# Patient Record
Sex: Male | Born: 1975 | Race: White | Hispanic: Yes | Marital: Single | Smoking: Never smoker
Health system: Southern US, Community
[De-identification: ages and names within clinical notes are randomized; demographics above are authoritative.]

## PROBLEM LIST (undated history)

## (undated) DIAGNOSIS — Z789 Other specified health status: Secondary | ICD-10-CM

## (undated) HISTORY — PX: FRACTURE SURGERY: SHX138

---

## 2005-11-06 ENCOUNTER — Emergency Department (HOSPITAL_COMMUNITY): Admission: EM | Admit: 2005-11-06 | Discharge: 2005-11-06 | Payer: Self-pay | Admitting: Emergency Medicine

## 2009-04-04 ENCOUNTER — Emergency Department (HOSPITAL_COMMUNITY): Admission: EM | Admit: 2009-04-04 | Discharge: 2009-04-04 | Payer: Self-pay | Admitting: Emergency Medicine

## 2010-10-12 LAB — GC/CHLAMYDIA PROBE AMP, URINE
Chlamydia, Swab/Urine, PCR: NEGATIVE
GC Probe Amp, Urine: NEGATIVE

## 2010-10-12 LAB — URINALYSIS, ROUTINE W REFLEX MICROSCOPIC
Glucose, UA: NEGATIVE mg/dL
Protein, ur: NEGATIVE mg/dL
Specific Gravity, Urine: 1.004 — ABNORMAL LOW (ref 1.005–1.030)
pH: 7.5 (ref 5.0–8.0)

## 2010-10-12 LAB — URINE CULTURE

## 2010-10-12 LAB — URINE MICROSCOPIC-ADD ON

## 2011-08-08 ENCOUNTER — Encounter (HOSPITAL_BASED_OUTPATIENT_CLINIC_OR_DEPARTMENT_OTHER): Payer: Self-pay

## 2011-08-08 ENCOUNTER — Emergency Department (HOSPITAL_BASED_OUTPATIENT_CLINIC_OR_DEPARTMENT_OTHER)
Admission: EM | Admit: 2011-08-08 | Discharge: 2011-08-08 | Disposition: A | Discharge status: Home / Self Care | Payer: No Typology Code available for payment source | Attending: Emergency Medicine | Admitting: Emergency Medicine

## 2011-08-08 ENCOUNTER — Emergency Department (INDEPENDENT_AMBULATORY_CARE_PROVIDER_SITE_OTHER): Payer: No Typology Code available for payment source

## 2011-08-08 DIAGNOSIS — Y92410 Unspecified street and highway as the place of occurrence of the external cause: Secondary | ICD-10-CM | POA: Insufficient documentation

## 2011-08-08 DIAGNOSIS — M549 Dorsalgia, unspecified: Secondary | ICD-10-CM | POA: Insufficient documentation

## 2011-08-08 DIAGNOSIS — S199XXA Unspecified injury of neck, initial encounter: Secondary | ICD-10-CM

## 2011-08-08 DIAGNOSIS — M545 Low back pain: Secondary | ICD-10-CM

## 2011-08-08 DIAGNOSIS — M542 Cervicalgia: Secondary | ICD-10-CM | POA: Insufficient documentation

## 2011-08-08 DIAGNOSIS — S0993XA Unspecified injury of face, initial encounter: Secondary | ICD-10-CM

## 2011-08-08 MED ORDER — HYDROCODONE-ACETAMINOPHEN 5-500 MG PO TABS: 1.0000 | ORAL_TABLET | Freq: Four times a day (QID) | ORAL | Status: DC | PRN
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
## 2011-08-08 NOTE — ED Notes (Signed)
Pt log rolled maintaining c-spine immobilization. Backboard removed by Teressa Lower, FNP.  C-Collar remains in place. Pt informed of plan of care.

## 2011-08-08 NOTE — ED Provider Notes (Signed)
Medical screening examination/treatment/procedure(s) were conducted as a shared visit with non-physician practitioner(s) and myself.  I personally evaluated the patient during the encounter   Dione Booze, MD 08/08/11 [phone]

## 2011-08-08 NOTE — ED Notes (Signed)
Patient transported to X-ray 

## 2011-08-13 ENCOUNTER — Emergency Department (HOSPITAL_COMMUNITY): Payer: Self-pay

## 2011-08-13 ENCOUNTER — Encounter (HOSPITAL_COMMUNITY): Payer: Self-pay | Admitting: *Deleted

## 2011-08-13 ENCOUNTER — Inpatient Hospital Stay (HOSPITAL_COMMUNITY)
Admission: EM | Admit: 2011-08-13 | Discharge: 2011-08-16 | DRG: 343 | Disposition: A | Discharge status: Home / Self Care | Payer: Self-pay | Source: Ambulatory Visit | Attending: General Surgery | Admitting: General Surgery

## 2011-08-13 DIAGNOSIS — R109 Unspecified abdominal pain: Secondary | ICD-10-CM | POA: Diagnosis present

## 2011-08-13 DIAGNOSIS — K3580 Unspecified acute appendicitis: Secondary | ICD-10-CM

## 2011-08-13 DIAGNOSIS — R300 Dysuria: Secondary | ICD-10-CM | POA: Diagnosis present

## 2011-08-13 HISTORY — DX: Other specified health status: Z78.9

## 2011-08-13 LAB — COMPREHENSIVE METABOLIC PANEL
ALT: 28 U/L (ref 0–53)
AST: 22 U/L (ref 0–37)
Alkaline Phosphatase: 47 U/L (ref 39–117)
CO2: 21 mEq/L (ref 19–32)
Calcium: 9.8 mg/dL (ref 8.4–10.5)
GFR calc Af Amer: >90 mL/min (ref >90)
GFR calc non Af Amer: >90 mL/min (ref >90)
Glucose, Bld: 122 mg/dL — ABNORMAL HIGH (ref 70–99)
Potassium: 3.1 mEq/L — ABNORMAL LOW (ref 3.5–5.1)
Sodium: 133 mEq/L — ABNORMAL LOW (ref 135–145)
Total Protein: 7.6 g/dL (ref 6.0–8.3)

## 2011-08-13 LAB — URINALYSIS, ROUTINE W REFLEX MICROSCOPIC
Bilirubin Urine: NEGATIVE
Hgb urine dipstick: NEGATIVE
Ketones, ur: 40 mg/dL — AB
Nitrite: NEGATIVE
Specific Gravity, Urine: 1.036 — ABNORMAL HIGH (ref 1.005–1.030)
pH: 6 (ref 5.0–8.0)

## 2011-08-13 LAB — CBC
Hemoglobin: 15.2 g/dL (ref 13.0–17.0)
Platelets: 229 10*3/uL (ref 150–400)
RBC: 5.04 MIL/uL (ref 4.22–5.81)

## 2011-08-13 LAB — LIPASE, BLOOD: Lipase: 40 U/L (ref 11–59)

## 2011-08-13 MED ORDER — ONDANSETRON HCL 4 MG/2ML IJ SOLN
4.0000 mg | Freq: Four times a day (QID) | INTRAMUSCULAR | Status: DC | PRN
Start: 2011-08-13 — End: 2011-08-16
  Administered 2011-08-13: 4 mg via INTRAVENOUS
  Filled 2011-08-13: qty 2

## 2011-08-13 MED ORDER — HYDROMORPHONE HCL PF 1 MG/ML IJ SOLN
1.0000 mg | Freq: Once | INTRAMUSCULAR | Status: AC
  Administered 2011-08-13: 1 mg via INTRAVENOUS
  Filled 2011-08-13: qty 1

## 2011-08-13 MED ORDER — KCL IN DEXTROSE-NACL 20-5-0.45 MEQ/L-%-% IV SOLN
INTRAVENOUS | Status: DC
  Administered 2011-08-13 – 2011-08-15 (×3): via INTRAVENOUS
  Filled 2011-08-13 (×7): qty 1000

## 2011-08-13 MED ORDER — IOHEXOL 300 MG/ML  SOLN
80.0000 mL | Freq: Once | INTRAMUSCULAR | Status: AC | PRN
  Administered 2011-08-13: 80 mL via INTRAVENOUS

## 2011-08-13 MED ORDER — DIPHENHYDRAMINE HCL 50 MG/ML IJ SOLN
12.5000 mg | Freq: Four times a day (QID) | INTRAMUSCULAR | Status: DC | PRN
  Administered 2011-08-15: 25 mg via INTRAVENOUS
  Filled 2011-08-13: qty 1

## 2011-08-13 MED ORDER — INFLUENZA VIRUS VACC SPLIT PF IM SUSP
0.5000 mL | INTRAMUSCULAR | Status: AC
  Filled 2011-08-13: qty 0.5

## 2011-08-13 MED ORDER — ONDANSETRON HCL 4 MG/2ML IJ SOLN
4.0000 mg | Freq: Once | INTRAMUSCULAR | Status: AC
  Administered 2011-08-13: 4 mg via INTRAVENOUS
  Filled 2011-08-13: qty 2

## 2011-08-13 MED ORDER — POTASSIUM CHLORIDE CRYS ER 20 MEQ PO TBCR
40.0000 meq | EXTENDED_RELEASE_TABLET | Freq: Once | ORAL | Status: AC
  Administered 2011-08-13: 40 meq via ORAL
  Filled 2011-08-13: qty 2

## 2011-08-13 MED ORDER — MORPHINE SULFATE 2 MG/ML IJ SOLN
1.0000 mg | INTRAMUSCULAR | Status: DC | PRN
  Administered 2011-08-13 (×2): 2 mg via INTRAVENOUS
  Administered 2011-08-13: 4 mg via INTRAVENOUS
  Filled 2011-08-13: qty 2
  Filled 2011-08-13 (×2): qty 1

## 2011-08-13 MED ORDER — SODIUM CHLORIDE 0.9 % IV BOLUS (SEPSIS)
1000.0000 mL | Freq: Once | INTRAVENOUS | Status: AC
  Administered 2011-08-13: 1000 mL via INTRAVENOUS

## 2011-08-13 MED ORDER — MORPHINE SULFATE 2 MG/ML IJ SOLN
1.0000 mg | INTRAMUSCULAR | Status: DC | PRN
  Administered 2011-08-14 – 2011-08-15 (×5): 2 mg via INTRAVENOUS
  Filled 2011-08-13 (×5): qty 1

## 2011-08-13 MED ORDER — ACETAMINOPHEN 325 MG PO TABS
650.0000 mg | ORAL_TABLET | ORAL | Status: DC | PRN
  Administered 2011-08-13 – 2011-08-15 (×3): 650 mg via ORAL
  Filled 2011-08-13 (×3): qty 2

## 2011-08-13 MED ORDER — IOHEXOL 300 MG/ML  SOLN: 20.0000 mL | INTRAMUSCULAR | Status: DC

## 2011-08-13 MED ORDER — DIPHENHYDRAMINE HCL 12.5 MG/5ML PO ELIX
12.5000 mg | ORAL_SOLUTION | Freq: Four times a day (QID) | ORAL | Status: DC | PRN
  Filled 2011-08-13: qty 10

## 2011-08-13 NOTE — ED Notes (Signed)
Report given to Tammy Sours, Charity fundraiser. Pt moved to exam room 32

## 2011-08-13 NOTE — ED Notes (Signed)
Patient currently sleeping.  Girlfriend at bedside watching TV.  Patient has warm blanket on.

## 2011-08-13 NOTE — ED Notes (Signed)
Pt sts since midnight having mid upper abd pain with vomiting and sts emesis had a little blood in it.  Pt does not drink alcohol.  No back pain, no diarrhea.  Pt uncomfortable.

## 2011-08-13 NOTE — ED Notes (Signed)
MD at bedside. 
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
## 2011-08-13 NOTE — ED Notes (Signed)
Gave family member at bedside phone number if ED. Patient resting comfortably on stretcher. Verbalized understanding plan of care.

## 2011-08-13 NOTE — ED Notes (Signed)
Patient resting comfortably on stretcher with girlfriend at bedside.  Pain improved with medication stated by patient. Currently 7/10 achy pain generalized abdominal pain .

## 2011-08-13 NOTE — ED Provider Notes (Signed)
History     CSN: [id number]  Arrival date & time 08/13/11  0515   First MD Initiated Contact with Patient 08/13/11 [phone]      Chief Complaint  Patient presents with  . Abdominal Pain  . Emesis    (Consider location/radiation/quality/duration/timing/severity/associated sxs/prior treatment) HPI  This patient presents to the emergency department with complaints of  diffuse abdominal pain that is worse RLQ  that started 2 hours after eating at work last night. The patient ate rice and chicken at 10 PM and then at 12 AM began to have abdominal pain with 5 episodes of vomiting. After the patient vomited multiple times the pain did not subside therefore patient came to the emergency department for treatment. The patient denies a history of abdominal pain, history of abdominal surgeries, denies constipation, diarrhea, fever. The patient denies use of alcohol. The patient says the pain as all over and not worse in any 1 area. The pain does not radiate down to the testicles.  History reviewed. No pertinent past medical history.  Past Surgical History  Procedure Date  . Elbow surgery     No family history on file.  History  Substance Use Topics  . Smoking status: Never Smoker   . Smokeless tobacco: Never Used  . Alcohol Use: Yes     occasional      Review of Systems  Allergies  Review of patient's allergies indicates no known allergies.  Home Medications   Current Outpatient Rx  Name Route Sig Dispense Refill  . ACETAMINOPHEN 500 MG PO TABS Oral Take 500 mg by mouth once as needed. For pain    . HYDROCODONE-ACETAMINOPHEN 5-500 MG PO TABS Oral Take 1-2 tablets by mouth every 6 (six) hours as needed for pain. 10 tablet 0    BP 125/72  Pulse 84  Temp(Src) 98.3 F (36.8 C) (Oral)  Resp 16  SpO2 96%  Physical Exam  Nursing note and vitals reviewed. Constitutional: He is oriented to person, place, and time. He appears well-developed and well-nourished. Distressed: pt  uncomfortable with pain.  HENT:  Head: Normocephalic and atraumatic.  Eyes: Pupils are equal, round, and reactive to light.  Neck: Normal range of motion.  Cardiovascular: Normal rate and regular rhythm.   Pulmonary/Chest: Effort normal and breath sounds normal.  Abdominal: Soft. He exhibits no shifting dullness, no distension and no pulsatile liver. There is tenderness (diffuse tenderness more localized in RLQ) in the right lower quadrant. There is guarding. No hernia.  Musculoskeletal: Normal range of motion.  Neurological: He is oriented to person, place, and time.  Skin: Skin is warm and dry.    ED Course  Procedures (including critical care time)  Labs Reviewed  CBC - Abnormal; Notable for the following:    WBC 13.3 (*)    All other components within normal limits  COMPREHENSIVE METABOLIC PANEL - Abnormal; Notable for the following:    Sodium 133 (*)    Potassium 3.1 (*)    Glucose, Bld 122 (*)    All other components within normal limits  LIPASE, BLOOD  URINALYSIS, ROUTINE W REFLEX MICROSCOPIC   Ct Abdomen Pelvis W Contrast  08/13/2011  *RADIOLOGY REPORT*  Clinical Data: Diffuse abdominal pain  CT ABDOMEN AND PELVIS WITH CONTRAST  Technique:  Multidetector CT imaging of the abdomen and pelvis was performed following the standard protocol during bolus administration of intravenous contrast.  Contrast: 80mL OMNIPAQUE IOHEXOL 300 MG/ML IV SOLN  Comparison: None.  Findings: Liver, gallbladder, spleen, pancreas,  adrenal glands, kidneys are within normal limits.  Bladder and prostate are unremarkable.  No free fluid.  No abnormal adenopathy.  The appendix contains gas bubbles and is not demonstrate contained fluid, abscess, or adjacent inflammatory changes.  There is therefore no evidence of acute appendicitis.  The appendix is marked by arrows.  A small appendicolith is suspected on image 63.  IMPRESSION: No acute intra-abdominal or intrapelvic pathology.  Original Report Authenticated  By: Donavan Burnet, M.D.     1. Abdominal pain       MDM  Pt also evaluated by  Dr. [name] who agrees with my plan for cbc, cmp, lipase, urinalysis, and CT abd/pelv with contrast.  CT scan came back negative for intraabdominal abnormality.Gas bubbles within appendix noted. Pt still having pain. Will send to the CDU for pain management and if pain is not able to be controlled a  repeat CT scan is recommended. If patients pain can be controlled then very good instructions about appendicitis symptoms and when to return to the ED should be give with pain and nausea medications. Care handed off to [facility], [place].  CT scan showed Gas bubble and an appendicolith in the appendix. Therefore, I went to ask the radiologist about this and got a 2nd and 3rd opinion, these radiologist believe that the distal tip of the appendix is positive for early appendicitis.      Dorthula Matas, PA 08/13/11 0930  Dorthula Matas, PA 08/13/11 [phone]

## 2011-08-13 NOTE — H&P (Signed)
C/O epigastric pain. Was sleeping on my arrival.  Will admit for hydration and F/U labs and exam. Patient examined and I agree with the assessment and plan  Violeta Gelinas, MD, MPH, FACS Pager: [phone]  08/13/2011 3:35 PM

## 2011-08-13 NOTE — ED Notes (Signed)
Pt states onset of generalized abdominal pain, n/v around midnight tonight after getting home from work.  Pt states he had eaten approx 1 hour before abd pain.  Pain persisted after emesis.  Denies diarrhea.

## 2011-08-13 NOTE — ED Notes (Signed)
Pt. Advised to give a sample of urine for a UA, and is noted to slowly be drinking oral contrast.

## 2011-08-13 NOTE — ED Notes (Signed)
Pt transported to CT scan.

## 2011-08-13 NOTE — ED Notes (Signed)
[id number] Ready

## 2011-08-13 NOTE — ED Provider Notes (Signed)
Medical screening examination/treatment/procedure(s) were conducted as a shared visit with non-physician practitioner(s) and myself.  I personally evaluated the patient during the encounter.  diffuse abdominal pain and tenderness. CT showed possible appy, admitted  Wesley Arnold. [name], MD 08/13/11 [phone]

## 2011-08-13 NOTE — ED Notes (Signed)
Pharmacy was called about IVF. Will verify and send IVF.

## 2011-08-13 NOTE — ED Notes (Signed)
Admit Doctor at bedside.  

## 2011-08-13 NOTE — ED Provider Notes (Signed)
9:23 AM Patient with a no sig hx (no abdominal surgery, IBS, IBD, alcohol abuse, DM)  was placed in CDU on observation for pain management. Patient care resumed from Marlon Pel, [place] & Dr. Hyman Hopes .  Patient is here for diffuse abd pain that localizes in the RLQ and has received Dilaudid 1 mg x3, zofran, and fluids.  CT showed no acute intra-abdominal or intrapelvic pathology, WBC 13.3. Pt has mild hyponatremia & hypokalemia. potassium given PO.  Patient re-evaluated and is resting comfortable, however states his pain is still 8/10, VSS, with no new complaints or concerns at this time. Plan per previous provider is to manage pain, finish second fluid bolus and re-evaluate. If unable to manage pain in the next xouple of hourse, consider repeating CT scan per Dr. Hyman Hopes On exam: hemodynamically stable, NAD, heart w/ RRR, lungs CTAB, abd w diffuse tenderness (greatest in RLQ), no peripheral edema or calf tenderness.   9:57 AM Pt CT abd discussed with radiologist Dr. Ova Freshwater in regards to pts presentation being likely appendicitis and his anatomy concerning for an appendicolith. An addendum to the CT interpretation has been made and results are now concerning for early distal tip appendicitis. Results have been discussed with Marlon Pel and Dr. Hyman Hopes. I have paged general surgery to call Dr. Marland Mcalpine phone.   Jaci Carrel, [place] 08/13/11 1552

## 2011-08-13 NOTE — ED Notes (Signed)
Pt resting quietly at the time. States 8/10 diffuse abdominal pain. No active vomiting. Vital signs stable. Family at bedside to translate. No signs of distress noted at the time.

## 2011-08-14 ENCOUNTER — Encounter (HOSPITAL_COMMUNITY): Admission: EM | Disposition: A | Discharge status: Home / Self Care | Payer: Self-pay | Source: Ambulatory Visit

## 2011-08-14 ENCOUNTER — Observation Stay (HOSPITAL_COMMUNITY): Payer: Self-pay | Admitting: Anesthesiology

## 2011-08-14 ENCOUNTER — Encounter (HOSPITAL_COMMUNITY): Payer: Self-pay | Admitting: Anesthesiology

## 2011-08-14 ENCOUNTER — Other Ambulatory Visit (INDEPENDENT_AMBULATORY_CARE_PROVIDER_SITE_OTHER): Payer: Self-pay | Admitting: General Surgery

## 2011-08-14 HISTORY — PX: LAPAROSCOPIC APPENDECTOMY: SHX408

## 2011-08-14 LAB — CBC
MCH: 29.7 pg (ref 26.0–34.0)
MCV: 87.1 fL (ref 78.0–100.0)
Platelets: 197 10*3/uL (ref 150–400)
RBC: 5.02 MIL/uL (ref 4.22–5.81)

## 2011-08-14 LAB — BASIC METABOLIC PANEL
CO2: 26 mEq/L (ref 19–32)
Calcium: 9.2 mg/dL (ref 8.4–10.5)
Creatinine, Ser: 1.12 mg/dL (ref 0.50–1.35)
Glucose, Bld: 109 mg/dL — ABNORMAL HIGH (ref 70–99)
Sodium: 138 mEq/L (ref 135–145)

## 2011-08-14 SURGERY — APPENDECTOMY, LAPAROSCOPIC
Anesthesia: General | Site: Abdomen | Wound class: Contaminated

## 2011-08-14 MED ORDER — SODIUM CHLORIDE 0.9 % IV SOLN
1.0000 g | Freq: Once | INTRAVENOUS | Status: DC
  Filled 2011-08-14: qty 1

## 2011-08-14 MED ORDER — FENTANYL CITRATE 0.05 MG/ML IJ SOLN
INTRAMUSCULAR | Status: DC | PRN
  Administered 2011-08-14: 150 ug via INTRAVENOUS
  Administered 2011-08-14: 100 ug via INTRAVENOUS

## 2011-08-14 MED ORDER — LACTATED RINGERS IV SOLN
INTRAVENOUS | Status: DC
Start: 2011-08-14 — End: 2011-08-14
  Administered 2011-08-14: 11:00:00 via INTRAVENOUS

## 2011-08-14 MED ORDER — GLYCOPYRROLATE 0.2 MG/ML IJ SOLN
INTRAMUSCULAR | Status: DC | PRN
  Administered 2011-08-14: .6 mg via INTRAVENOUS

## 2011-08-14 MED ORDER — 0.9 % SODIUM CHLORIDE (POUR BTL) OPTIME
TOPICAL | Status: DC | PRN
  Administered 2011-08-14: 1000 mL

## 2011-08-14 MED ORDER — ONDANSETRON HCL 4 MG/2ML IJ SOLN
INTRAMUSCULAR | Status: DC | PRN
  Administered 2011-08-14: 4 mg via INTRAVENOUS

## 2011-08-14 MED ORDER — OXYCODONE HCL 5 MG PO TABS
5.0000 mg | ORAL_TABLET | ORAL | Status: DC | PRN
  Administered 2011-08-15 – 2011-08-16 (×5): 10 mg via ORAL
  Filled 2011-08-14 (×5): qty 2

## 2011-08-14 MED ORDER — SODIUM CHLORIDE 0.9 % IR SOLN
Status: DC | PRN
  Administered 2011-08-14: 1000 mL

## 2011-08-14 MED ORDER — PROPOFOL 10 MG/ML IV EMUL
INTRAVENOUS | Status: DC | PRN
  Administered 2011-08-14: 170 mg via INTRAVENOUS
  Administered 2011-08-14: 30 mg via INTRAVENOUS

## 2011-08-14 MED ORDER — MOXIFLOXACIN HCL IN NACL 400 MG/250ML IV SOLN
INTRAVENOUS | Status: DC | PRN
  Administered 2011-08-14: 400 mg via INTRAVENOUS

## 2011-08-14 MED ORDER — ROCURONIUM BROMIDE 100 MG/10ML IV SOLN
INTRAVENOUS | Status: DC | PRN
  Administered 2011-08-14: 50 mg via INTRAVENOUS

## 2011-08-14 MED ORDER — MOXIFLOXACIN HCL IN NACL 400 MG/250ML IV SOLN
400.0000 mg | INTRAVENOUS | Status: DC
  Administered 2011-08-15 – 2011-08-16 (×2): 400 mg via INTRAVENOUS
  Filled 2011-08-14 (×3): qty 250

## 2011-08-14 MED ORDER — LACTATED RINGERS IV SOLN
INTRAVENOUS | Status: DC | PRN
  Administered 2011-08-14: 12:00:00 via INTRAVENOUS

## 2011-08-14 MED ORDER — ESMOLOL HCL 10 MG/ML IV SOLN
INTRAVENOUS | Status: DC | PRN
  Administered 2011-08-14: 10 mg via INTRAVENOUS

## 2011-08-14 MED ORDER — BUPIVACAINE-EPINEPHRINE 0.25% -1:200000 IJ SOLN
INTRAMUSCULAR | Status: DC | PRN
  Administered 2011-08-14: 10 mL

## 2011-08-14 MED ORDER — HYDROMORPHONE HCL PF 1 MG/ML IJ SOLN
0.2500 mg | INTRAMUSCULAR | Status: DC | PRN
  Administered 2011-08-14 (×2): 0.5 mg via INTRAVENOUS

## 2011-08-14 MED ORDER — ONDANSETRON HCL 4 MG/2ML IJ SOLN: 4.0000 mg | Freq: Four times a day (QID) | INTRAMUSCULAR | Status: DC | PRN

## 2011-08-14 SURGICAL SUPPLY — 53 items
ADH SKN CLS APL DERMABOND .7 (GAUZE/BANDAGES/DRESSINGS) ×1
APPLIER CLIP ROT 10 11.4 M/L (STAPLE)
APR CLP MED LRG 11.4X10 (STAPLE)
BAG SPEC RTRVL LRG 6X4 10 (ENDOMECHANICALS) ×1
BLADE SURG ROTATE 9660 (MISCELLANEOUS) ×2 IMPLANT
CANISTER SUCTION 2500CC (MISCELLANEOUS) ×2 IMPLANT
CHLORAPREP W/TINT 26ML (MISCELLANEOUS) ×2 IMPLANT
CLIP APPLIE ROT 10 11.4 M/L (STAPLE) IMPLANT
CLOTH BEACON ORANGE TIMEOUT ST (SAFETY) ×2 IMPLANT
COVER SURGICAL LIGHT HANDLE (MISCELLANEOUS) ×2 IMPLANT
CUTTER LINEAR ENDO 35 ETS (STAPLE) IMPLANT
CUTTER LINEAR ENDO 35 ETS TH (STAPLE) ×2 IMPLANT
DECANTER SPIKE VIAL GLASS SM (MISCELLANEOUS) ×2 IMPLANT
DERMABOND ADVANCED (GAUZE/BANDAGES/DRESSINGS) ×1
DERMABOND ADVANCED .7 DNX12 (GAUZE/BANDAGES/DRESSINGS) ×1 IMPLANT
DRAPE UTILITY 15X26 W/TAPE STR (DRAPE) ×4 IMPLANT
ELECT REM PT RETURN 9FT ADLT (ELECTROSURGICAL) ×2
ELECTRODE REM PT RTRN 9FT ADLT (ELECTROSURGICAL) ×1 IMPLANT
ENDOLOOP SUT PDS II  0 18 (SUTURE)
ENDOLOOP SUT PDS II 0 18 (SUTURE) IMPLANT
GLOVE BIO SURGEON STRL SZ7 (GLOVE) ×2 IMPLANT
GLOVE BIO SURGEON STRL SZ8 (GLOVE) ×2 IMPLANT
GLOVE BIOGEL M STRL SZ7.5 (GLOVE) ×2 IMPLANT
GLOVE BIOGEL PI IND STRL 7.0 (GLOVE) ×1 IMPLANT
GLOVE BIOGEL PI IND STRL 7.5 (GLOVE) ×1 IMPLANT
GLOVE BIOGEL PI IND STRL 8 (GLOVE) ×1 IMPLANT
GLOVE BIOGEL PI INDICATOR 7.0 (GLOVE) ×1
GLOVE BIOGEL PI INDICATOR 7.5 (GLOVE) ×1
GLOVE BIOGEL PI INDICATOR 8 (GLOVE) ×1
GLOVE EXAM NITRILE MD LF STRL (GLOVE) ×2 IMPLANT
GOWN PREVENTION PLUS XLARGE (GOWN DISPOSABLE) ×2 IMPLANT
GOWN STRL NON-REIN LRG LVL3 (GOWN DISPOSABLE) ×4 IMPLANT
KIT BASIN OR (CUSTOM PROCEDURE TRAY) ×2 IMPLANT
KIT ROOM TURNOVER OR (KITS) ×2 IMPLANT
NS IRRIG 1000ML POUR BTL (IV SOLUTION) ×2 IMPLANT
PAD ARMBOARD 7.5X6 YLW CONV (MISCELLANEOUS) ×4 IMPLANT
POUCH SPECIMEN RETRIEVAL 10MM (ENDOMECHANICALS) ×2 IMPLANT
RELOAD /EVU35 (ENDOMECHANICALS) IMPLANT
RELOAD CUTTER ETS 35MM STAND (ENDOMECHANICALS) IMPLANT
SCALPEL HARMONIC ACE (MISCELLANEOUS) ×2 IMPLANT
SET IRRIG TUBING LAPAROSCOPIC (IRRIGATION / IRRIGATOR) ×2 IMPLANT
SPECIMEN JAR SMALL (MISCELLANEOUS) ×2 IMPLANT
SUT VIC AB 4-0 PS2 27 (SUTURE) ×2 IMPLANT
SWAB COLLECTION DEVICE MRSA (MISCELLANEOUS) IMPLANT
TOWEL OR 17X24 6PK STRL BLUE (TOWEL DISPOSABLE) ×2 IMPLANT
TOWEL OR 17X26 10 PK STRL BLUE (TOWEL DISPOSABLE) ×2 IMPLANT
TRAY FOLEY CATH 14FR (SET/KITS/TRAYS/PACK) ×2 IMPLANT
TRAY LAPAROSCOPIC (CUSTOM PROCEDURE TRAY) ×2 IMPLANT
TROCAR HASSON GELL 12X100 (TROCAR) ×2 IMPLANT
TROCAR Z-THREAD FIOS 12X100MM (TROCAR) ×2 IMPLANT
TROCAR Z-THREAD FIOS 5X100MM (TROCAR) ×2 IMPLANT
TUBE ANAEROBIC SPECIMEN COL (MISCELLANEOUS) IMPLANT
WATER STERILE IRR 1000ML POUR (IV SOLUTION) ×2 IMPLANT

## 2011-08-14 NOTE — Preoperative (Signed)
Beta Blockers   Reason not to administer Beta Blockers: not prescribed 

## 2011-08-14 NOTE — ED Provider Notes (Signed)
Medical screening examination/treatment/procedure(s) were conducted as a shared visit with non-physician practitioner(s) and myself.  I personally evaluated the patient during the encounter  Continued RLQ pain. PA Neva Seat and Drue Novel reviewed with radiology. There is concern for appendicitis on CT A/P. PA paged general surgery to me but call went instead to PA who discussed the case with surgery.  Forbes Cellar, MD 08/14/11 [phone]

## 2011-08-14 NOTE — Progress Notes (Signed)
INITIAL ADULT NUTRITION ASSESSMENT Date: 08/14/2011   Time: 9:47 AM  Reason for Assessment: Nutrition Risk Report  ASSESSMENT: Male 36 y.o.  Dx: Abdominal pain  Hx:  Past Medical History  Diagnosis Date  . No pertinent past medical history     Related Meds:     . influenza  inactive virus vaccine  0.5 mL Intramuscular Tomorrow-1000  . moxifloxacin  400 mg Intravenous Q24H  . DISCONTD: ertapenem  1 g Intravenous Once  . DISCONTD: iohexol  20 mL Oral Q1 Hr x 2    Ht: 5\' 6"  (167.6 cm)  Wt: 153 lb 8.9 oz (69.652 kg)  Ideal Wt: 64.5 kg % Ideal Wt: 108%  Usual Wt: 165 lb % Usual Wt: 93%  Body mass index is 24.78 kg/(m^2).  Food/Nutrition Related Hx: unintentional weight loss > 10 lbs within the past month per nutrition screen  Labs:  CMP     Component Value Date/Time   NA 138 08/14/2011 0510   K 4.2 08/14/2011 0510   CL 103 08/14/2011 0510   CO2 26 08/14/2011 0510   GLUCOSE 109* 08/14/2011 0510   BUN 9 08/14/2011 0510   CREATININE 1.12 08/14/2011 0510   CALCIUM 9.2 08/14/2011 0510   PROT 7.6 08/13/2011 0634   ALBUMIN 4.4 08/13/2011 0634   AST 22 08/13/2011 0634   ALT 28 08/13/2011 0634   ALKPHOS 47 08/13/2011 0634   BILITOT 0.4 08/13/2011 0634   GFRNONAA 85* 08/14/2011 0510   GFRAA >90 08/14/2011 0510    I/O last 3 completed shifts: In: 1560 [P.O.:360; I.V.:1200] Out: 1700 [Urine:1700]    Diet Order: NPO  Supplements/Tube Feeding: N/A  IVF:    dextrose 5 % and 0.45 % NaCl with KCl 20 mEq/L Last Rate: 100 mL/hr at 08/13/11 1720    Estimated Nutritional Needs:   Kcal: 1800-2000 Protein: 90-100 gm Fluid: 1.8-2.0 L  RD spoke with the pt & pt's girlfriend re: nutrition hx -- pt reports he's lost approximately 10-15 lbs in the past 2 weeks -- his girlfriend suspects this is stress related as he recently started another job; noted onset of N/V, abdominal pain night before admission; currently NPO for diagnostic laparoscopy, possible appendectomy; would benefit from addition of  nutrition supplements -- RD to order when/as able  NUTRITION DIAGNOSIS: -Inadequate oral intake (NI-2.1).  Status: Ongoing  RELATED TO: inability to eat  AS EVIDENCE BY: NPO status  MONITORING/EVALUATION(Goals): Goal: meet >90% of estimated nutrition needs to prevent further weight loss  Monitor: PO intake, diet advancement, weight, labs, I/O's  EDUCATION NEEDS: -No education needs identified at this time  INTERVENTION:  Advance diet as medically appropriate  RD to follow for nutrition care plan, add interventions accordingly  Dietitian #: [phone]  DOCUMENTATION CODES Per approved criteria  -Not Applicable    Alger Memos 08/14/2011, 9:47 AM

## 2011-08-14 NOTE — Transfer of Care (Signed)
Immediate Anesthesia Transfer of Care Note  Patient: Wesley Arnold  Procedure(s) Performed:  APPENDECTOMY LAPAROSCOPIC  Patient Location: PACU  Anesthesia Type: General  Level of Consciousness: awake, alert , oriented and sedated  Airway & Oxygen Therapy: Patient Spontanous Breathing and Patient connected to nasal cannula oxygen  Post-op Assessment: Report given to PACU RN, Post -op Vital signs reviewed and stable and Patient moving all extremities  Post vital signs: Reviewed and stable  Complications: No apparent anesthesia complications

## 2011-08-14 NOTE — Progress Notes (Signed)
UR of chart completed.  

## 2011-08-14 NOTE — Anesthesia Postprocedure Evaluation (Signed)
  Anesthesia Post-op Note  Patient: Wesley Arnold  Procedure(s) Performed:  APPENDECTOMY LAPAROSCOPIC  Patient Location: PACU  Anesthesia Type: General  Level of Consciousness: awake  Airway and Oxygen Therapy: Patient Spontanous Breathing  Post-op Pain: mild  Post-op Assessment: Post-op Vital signs reviewed  Post-op Vital Signs: stable  Complications: No apparent anesthesia complications

## 2011-08-14 NOTE — Op Note (Signed)
08/13/2011 - 08/14/2011  12:28 PM  PATIENT:  Wesley Arnold  36 y.o. male  PRE-OPERATIVE DIAGNOSIS:  abdominal pain  POST-OPERATIVE DIAGNOSIS:  Acute appendicitis  PROCEDURE:  Procedure(s): APPENDECTOMY LAPAROSCOPIC  SURGEON:  Surgeon(s): Liz Malady, MD  PHYSICIAN ASSISTANT:   ASSISTANTS: none   ANESTHESIA:   general  EBL:  Total I/O In: 800 [I.V.:800] Out: -   BLOOD ADMINISTERED:none  DRAINS: none   SPECIMEN:  Excision  DISPOSITION OF SPECIMEN:  PATHOLOGY  COUNTS:  YES  DICTATION: .Dragon Dictationpatient was admitted late yesterday afternoon with abdominal pain. Initial CT scan and ultrasound were negative. His white blood cell count was mildly elevated. This morning his abdominal pain had changed from generalized to localize in the right lower quadrant. Reevaluation of the CT scan by radiology questions acute appendicitis at the tip. Patient is therefore brought to the operating room for appendectomy. He is febrile. He is receiving intravenous antibiotics. Informed consent was obtained by myself in Spanish. He was brought to operating room and general endotracheal anesthesia was a Optician, dispensing by the anesthesia staff. We did time out procedure. His abdomen was prepped and draped in sterile fashion. Infraumbilical region was infiltrated with quarter percent Marcaine with epinephrine. Infraumbilical incision was made. Subcutaneous tissues were dissected down revealing the anterior fascia. This was divided sharply along the midline and the perineal cavity was entered under direct vision. 0 Vicryl pursestring suture was placed on the fascial opening. Hassan trocar was inserted. Abdomen was insufflated with carbon dioxide in standard fashion. Under direct vision the 12 mm left lower quadrant and a 5 mm right mid abdomen port were placed. Local was used at each port sites as well. Laparoscopic exploration revealed a very inflamed but not perforated appendix. The mesoappendix was divided  with the harmonic scalpel. The base of the appendix was noninflamed. The base was divided with Endo GIA with vascular load. Appendix was placed in Endo Catch bag and removed from the left lower quadrant port site. The abdomen was copiously irrigated. No other gross abnormalities were seen. Irrigation fluid was clear. Staple line was intact. There was no bleeding. Pneumoperitoneum was released. Ports were removed. Infraumbilical fascia was closed by tying the 0 Vicryl pursestring suture. All 3 wounds were copiously irrigated. Skin of each was closed with running 4-0 Vicryl subcuticular stitch followed by Dermabond. Sponge needle and as we counts were correct. Patient tolerated procedure well without apparent complications to recovery room in stable condition.  PATIENT DISPOSITION:  PACU - hemodynamically stable.   Delay start of Pharmacological VTE agent (>24hrs) due to surgical blood loss or risk of bleeding:  no  Violeta Gelinas, MD, MPH, FACS Pager: [phone]  2/6/201312:28 PM

## 2011-08-14 NOTE — Anesthesia Preprocedure Evaluation (Signed)
Anesthesia Evaluation  Patient identified by MRN, date of birth, ID band Patient awake    Reviewed: Allergy & Precautions, H&P , NPO status , Patient's Chart, lab work & pertinent test results  Airway Mallampati: II  Neck ROM: full    Dental   Pulmonary          Cardiovascular     Neuro/Psych    GI/Hepatic   Endo/Other    Renal/GU      Musculoskeletal   Abdominal   Peds  Hematology   Anesthesia Other Findings   Reproductive/Obstetrics                           Anesthesia Physical Anesthesia Plan  ASA: I  Anesthesia Plan: General   Post-op Pain Management:    Induction: Intravenous  Airway Management Planned: Oral ETT  Additional Equipment:   Intra-op Plan:   Post-operative Plan: Extubation in OR  Informed Consent: I have reviewed the patients History and Physical, chart, labs and discussed the procedure including the risks, benefits and alternatives for the proposed anesthesia with the patient or authorized representative who has indicated his/her understanding and acceptance.     Plan Discussed with: CRNA and Surgeon  Anesthesia Plan Comments:         Anesthesia Quick Evaluation  

## 2011-08-14 NOTE — Progress Notes (Signed)
Subjective: Pain now more localized to RLQ  Objective: Vital signs in last 24 hours: Temp:  [97.2 F (36.2 C)-101.4 F (38.6 C)] 97.2 F (36.2 C) (02/06 0651) Pulse Rate:  [64-97] 79  (02/06 0651) Resp:  [16-20] 19  (02/06 0651) BP: (106-125)/(56-74) 119/74 mmHg (02/06 0651) SpO2:  [96 %-99 %] 98 % (02/06 0651) Weight:  [69.652 kg (153 lb 8.9 oz)] 69.652 kg (153 lb 8.9 oz) (02/05 1945) Last BM Date: 08/12/11  Intake/Output from previous day: 02/05 0701 - 02/06 0700 In: 1560 [P.O.:360; I.V.:1200] Out: 1700 [Urine:1700] Intake/Output this shift:    General appearance: alert and cooperative Resp: clear to auscultation bilaterally Cardio: regular rate and rhythm GI: tender RLQ now with voluntary guarding, minimal tenderness elsewhere  Lab Results:   Basename 08/14/11 0510 08/13/11 0634  WBC 15.2* 13.3*  HGB 14.9 15.2  HCT 43.7 42.6  PLT 197 229   BMET  Basename 08/14/11 0510 08/13/11 0634  NA 138 133*  K 4.2 3.1*  CL 103 96  CO2 26 21  GLUCOSE 109* 122*  BUN 9 19  CREATININE 1.12 1.01  CALCIUM 9.2 9.8   PT/INR No results found for this basename: LABPROT:2,INR:2 in the last 72 hours ABG No results found for this basename: PHART:2,PCO2:2,PO2:2,HCO3:2 in the last 72 hours  Studies/Results: US Abdomen Complete  08/13/2011  *RADIOLOGY REPORT*  Clinical Data:  Abdominal pain.  COMPLETE ABDOMINAL ULTRASOUND  Comparison:  CT scan dated 08/13/2011  Findings:  Gallbladder:  No gallstones, gallbladder wall thickening, or pericholecystic fluid.Sonographic Murphy's sign is absent.  Common bile duct:  Measures 3 mm in diameter, within normal limits.  Liver:  No focal lesion identified.  Within normal limits in parenchymal echogenicity.  IVC:  Appears normal.  Pancreas:  No focal abnormality seen.  Spleen:  Measures 5.7 cm craniocaudad and appears normal.  Right Kidney:  Measures 9.5 cm in length and appears normal.  Left Kidney:  Measures 11.3 cm in length and appears normal.   Abdominal aorta:  No aneurysm identified.  IMPRESSION:  1.  Normal sonographic appearance of the abdomen.  Original Report Authenticated By: Dellia Cloud, M.D.   Ct Abdomen Pelvis W Contrast  08/13/2011  **ADDENDUM** CREATED: 08/13/2011 09:46:48  The original report was by Dr. Maryclare Bean.  The following addendum is by Dr. Gaylyn Rong:  I reviewed these images with Jaci Carrel, PA-C from the emergency department at 9:40 a.m.  She felt that the right lower quadrant pain was localized and the clinical picture was compelling for possible appendicitis.  Upon image review, there is indistinctness of the tip of the appendicitis distal to the appendicolith, as shown on image 26 of series 602, with slight adjacent stranding and with appendiceal diameter potentially up to 1.1 cm in this vicinity.  The proximal appendix does appear relatively normal.  This is concerning for early tip appendicitis.  I reviewed this finding with Drs. Loralie Champagne and Charline Bills, who concur.  I discussed this by telephone with Jaci Carrel at 9:50 a.m. on 08/13/2011.  **END ADDENDUM** SIGNED BY: Soyla Murphy. Ova Freshwater, M.D.    08/13/2011  *RADIOLOGY REPORT*  Clinical Data: Diffuse abdominal pain  CT ABDOMEN AND PELVIS WITH CONTRAST  Technique:  Multidetector CT imaging of the abdomen and pelvis was performed following the standard protocol during bolus administration of intravenous contrast.  Contrast: 80mL OMNIPAQUE IOHEXOL 300 MG/ML IV SOLN  Comparison: None.  Findings: Liver, gallbladder, spleen, pancreas, adrenal glands, kidneys are within normal limits.  Bladder  and prostate are unremarkable.  No free fluid.  No abnormal adenopathy.  The appendix contains gas bubbles and is not demonstrate contained fluid, abscess, or adjacent inflammatory changes.  There is therefore no evidence of acute appendicitis.  The appendix is marked by arrows.  A small appendicolith is suspected on image 63.  IMPRESSION: No acute intra-abdominal or  intrapelvic pathology.  Original Report Authenticated By: Dellia Cloud, M.D.    Anti-infectives: Anti-infectives    None      Assessment/Plan: RLQ abdominal pain with elevated WBC and fever overnight - to OR for lap appy. Procedure, risks and benefits D/W patient by myself in Spanish.  Questions answered. He agrees. IV antibiotics on call to OR.   LOS: 1 day    Wesley Arnold E 08/14/2011

## 2011-08-15 LAB — CBC
Hemoglobin: 13.3 g/dL (ref 13.0–17.0)
Platelets: 162 10*3/uL (ref 150–400)
RBC: 4.59 MIL/uL (ref 4.22–5.81)

## 2011-08-15 MED ORDER — ENSURE CLINICAL ST REVIGOR PO LIQD: 237.0000 mL | Freq: Two times a day (BID) | ORAL | Status: DC

## 2011-08-15 NOTE — Progress Notes (Signed)
Patient examined and I agree with the assessment and plan I discussed the patient's clinical course with him in spanish Violeta Gelinas, MD, MPH, FACS Pager: [phone]  08/15/2011 10:14 AM

## 2011-08-15 NOTE — Progress Notes (Signed)
1 Day Post-Op  Subjective: Pt ok. Sore as expected. No N/V. Voiding ok, mild dysuria-likely from foley during procedure.   Objective: Vital signs in last 24 hours: Temp:  [98.2 F (36.8 C)-103.1 F (39.5 C)] 100.5 F (38.1 C) (02/07 0623) Pulse Rate:  [84-116] 84  (02/07 0623) Resp:  [11-20] 18  (02/07 0623) BP: (106-136)/(54-73) 120/67 mmHg (02/07 0623) SpO2:  [99 %-100 %] 100 % (02/07 0623) Last BM Date: 08/12/11  Intake/Output this shift:    Physical Exam: BP 120/67  Pulse 84  Temp(Src) 100.5 F (38.1 C) (Oral)  Resp 18  Ht 5\' 6"  (1.676 m)  Wt 69.652 kg (153 lb 8.9 oz)  BMI 24.78 kg/m2  SpO2 100% Lungs: CTA without w/r/r Heart: Regular Abdomen: soft, ND, appropriately tender   Incisions all c/d/i without erythema or hematoma.   Active BS Ext: No edema or tenderness   Labs: CBC  Basename 08/15/11 0640 08/14/11 0510  WBC 7.0 15.2*  HGB 13.3 14.9  HCT 40.8 43.7  PLT 162 197   BMET  Basename 08/14/11 0510 08/13/11 0634  NA 138 133*  K 4.2 3.1*  CL 103 96  CO2 26 21  GLUCOSE 109* 122*  BUN 9 19  CREATININE 1.12 1.01  CALCIUM 9.2 9.8   LFT  Basename 08/13/11 0634  PROT 7.6  ALBUMIN 4.4  AST 22  ALT 28  ALKPHOS 47  BILITOT 0.4  BILIDIR --  IBILI --  LIPASE 40   PT/INR No results found for this basename: LABPROT:2,INR:2 in the last 72 hours ABG No results found for this basename: PHART:2,PCO2:2,PO2:2,HCO3:2 in the last 72 hours  Studies/Results: US Abdomen Complete  08/13/2011  *RADIOLOGY REPORT*  Clinical Data:  Abdominal pain.  COMPLETE ABDOMINAL ULTRASOUND  Comparison:  CT scan dated 08/13/2011  Findings:  Gallbladder:  No gallstones, gallbladder wall thickening, or pericholecystic fluid.Sonographic Murphy's sign is absent.  Common bile duct:  Measures 3 mm in diameter, within normal limits.  Liver:  No focal lesion identified.  Within normal limits in parenchymal echogenicity.  IVC:  Appears normal.  Pancreas:  No focal abnormality seen.   Spleen:  Measures 5.7 cm craniocaudad and appears normal.  Right Kidney:  Measures 9.5 cm in length and appears normal.  Left Kidney:  Measures 11.3 cm in length and appears normal.  Abdominal aorta:  No aneurysm identified.  IMPRESSION:  1.  Normal sonographic appearance of the abdomen.  Original Report Authenticated By: Dellia Cloud, M.D.   Ct Abdomen Pelvis W Contrast  08/13/2011  **ADDENDUM** CREATED: 08/13/2011 09:46:48  The original report was by Dr. Maryclare Bean.  The following addendum is by Dr. Gaylyn Rong:  I reviewed these images with Jaci Carrel, PA-C from the emergency department at 9:40 a.m.  She felt that the right lower quadrant pain was localized and the clinical picture was compelling for possible appendicitis.  Upon image review, there is indistinctness of the tip of the appendicitis distal to the appendicolith, as shown on image 26 of series 602, with slight adjacent stranding and with appendiceal diameter potentially up to 1.1 cm in this vicinity.  The proximal appendix does appear relatively normal.  This is concerning for early tip appendicitis.  I reviewed this finding with Drs. Loralie Champagne and Charline Bills, who concur.  I discussed this by telephone with Jaci Carrel at 9:50 a.m. on 08/13/2011.  **END ADDENDUM** SIGNED BY: Soyla Murphy. Ova Freshwater, M.D.    08/13/2011  *RADIOLOGY REPORT*  Clinical Data: Diffuse abdominal  pain  CT ABDOMEN AND PELVIS WITH CONTRAST  Technique:  Multidetector CT imaging of the abdomen and pelvis was performed following the standard protocol during bolus administration of intravenous contrast.  Contrast: 80mL OMNIPAQUE IOHEXOL 300 MG/ML IV SOLN  Comparison: None.  Findings: Liver, gallbladder, spleen, pancreas, adrenal glands, kidneys are within normal limits.  Bladder and prostate are unremarkable.  No free fluid.  No abnormal adenopathy.  The appendix contains gas bubbles and is not demonstrate contained fluid, abscess, or adjacent inflammatory  changes.  There is therefore no evidence of acute appendicitis.  The appendix is marked by arrows.  A small appendicolith is suspected on image 63.  IMPRESSION: No acute intra-abdominal or intrapelvic pathology.  Original Report Authenticated By: Dellia Cloud, M.D.    Assessment: Principal Problem:  *Abdominal pain Appendicitis  Procedure(s): APPENDECTOMY LAPAROSCOPIC  Plan: Continue IV abx another 24hr. Increase diet today Probable DC tomorrow.  LOS: 2 days    Alyse Low 08/15/2011 7:45 AM

## 2011-08-16 ENCOUNTER — Encounter (HOSPITAL_COMMUNITY): Payer: Self-pay | Admitting: General Surgery

## 2011-08-16 MED ORDER — OXYCODONE-ACETAMINOPHEN 5-325 MG PO TABS: 1.0000 | ORAL_TABLET | ORAL | Status: AC | PRN

## 2011-08-16 MED ORDER — OXYCODONE-ACETAMINOPHEN 5-325 MG PO TABS
1.0000 | ORAL_TABLET | ORAL | Status: DC | PRN
  Administered 2011-08-16: 2 via ORAL
  Filled 2011-08-16: qty 2

## 2011-08-16 MED ORDER — INFLUENZA VIRUS VACC SPLIT PF IM SUSP
0.5000 mL | Freq: Once | INTRAMUSCULAR | Status: AC
  Administered 2011-08-16: 0.5 mL via INTRAMUSCULAR
  Filled 2011-08-16: qty 0.5

## 2011-08-16 NOTE — Discharge Summary (Signed)
Patient ID: Wesley Arnold MRN: [id number] DOB/AGE: [birth year] 36 y.o.  Admit date: 08/13/2011 Discharge date: 08/16/2011  Procedures:  Lap appy   Consults: None  Reason for Admission:  This is a 36 yo male who presented to [facility] with c/o severe epigastric abdominal pain.  He had a CT scan that was originally read as negative, but an addendum was made that questioned early appendicitis at the tip.  Because his symptoms were not c/w appendicitis and in the epigastrum, an abdominal u/s was obtained to rule out gallbladder disease.  This was negative.  He did have a WBC of 13k, and he was admitted for observation.   Admission Diagnoses: 1. abdominal pain  Hospital Course: The patient was admitted for observation.  The following day he was noted to have a WBC of 15k and a fever of 101.4.  His pain was now more localized in his RLQ.  He was then taken to the operating room for a diagnostic laparoscopy.  He was found to have acute appendicitis.  He had this removed.  Postoperatively, he had a high fever on POD# 1 and was kept for abx therapy.  His diet was advanced as tolerated.  By POD# 2, he was afebrile and tolerating a regular diet.  His pain was well controlled with po pain meds.  He was felt stable for discharge home.  Abd: soft, minimally tender, +BS, NE, incisions c/d/i  Discharge Diagnoses:  Principal Problem:  *Abdominal pain acute appendicitis, s/p lap appy  Discharge Medications: Medication List  As of 08/16/2011  9:19 AM   STOP taking these medications         acetaminophen 500 MG tablet      HYDROcodone-acetaminophen 5-500 MG per tablet         TAKE these medications         oxyCODONE-acetaminophen 5-325 MG per tablet   Commonly known as: PERCOCET   Take 1-2 tablets by mouth every 4 (four) hours as needed.            Discharge Instructions: Follow-up Information    Follow up with [facility] on 08/27/2011. (1:40pm, arrive at 1:20pm)    Contact information:     [address] [phone]         Signed: Letha Cape 08/16/2011, 9:19 AM

## 2011-08-16 NOTE — Discharge Summary (Signed)
Wesley Notaro, MD, MPH, FACS Pager: [phone]  

## 2011-08-16 NOTE — Progress Notes (Signed)
Pt is s/p lap appendectomy. Pt has 3 incisions of his abdomen that are open to air and dermabonded. No s/sx infection noted. Pt has faint sluggish bowel sounds x 4 but is passing gas and denies nausea or vomiting. Pt abdomen remains tender at incision sites and slightly bloated. Pt tolerates diet fair to good. Pt reps LBM 2/4. Pt has decreased ROM of abdomen related to pain of incisions. Pt instructed how to splint incisions for coughing and deep breathing. Pt verb understanding and agrees to comply. Pt performs IS per order. Pt lungs CTA but noted to be diminished in the bases. Pt denies cough. No s/sx resp or cardiac distress and no c/o such. Vital signs stable. Heart rate regular rate and rhythm.  Pt voids quantity sufficient without difficulty. Significant other at bedside and supportive.

## 2011-08-27 ENCOUNTER — Encounter (INDEPENDENT_AMBULATORY_CARE_PROVIDER_SITE_OTHER): Payer: Self-pay

## 2011-08-27 ENCOUNTER — Ambulatory Visit (INDEPENDENT_AMBULATORY_CARE_PROVIDER_SITE_OTHER): Payer: Self-pay | Admitting: Radiology

## 2011-08-27 VITALS — BP 114/78 | HR 64 | Temp 99.2°F | Resp 12 | Ht 66.0 in | Wt 154.4 lb

## 2011-08-27 DIAGNOSIS — Z9889 Other specified postprocedural states: Secondary | ICD-10-CM

## 2011-08-27 DIAGNOSIS — Z9049 Acquired absence of other specified parts of digestive tract: Secondary | ICD-10-CM

## 2011-08-27 NOTE — Progress Notes (Signed)
Wesley Arnold 08-15-1975 [id number] 08/27/2011   History of Present Illness: Bocephus Cali is a  36 y.o. male who presents today status post lap appy by Dr. Janee Morn.  Pathology reveals appendicitis.  The patient is tolerating a regular diet, having normal bowel movements, has good pain control. He's been having some mild pains at his umbilical incision. He is back to most normal activities.   Physical Exam: Abd: soft, nontender, active bowel sounds, nondistended.  All incisions are well healed. Minimal tenderness at umbilical site, no signs of infection.  Impression: 1.  Acute appendicitis, s/p lap appy  Plan: He is able to return to normal activities. He may follow up on a prn basis.  Marianna Fuss 08/27/2011

## 2011-09-11 ENCOUNTER — Encounter (INDEPENDENT_AMBULATORY_CARE_PROVIDER_SITE_OTHER): Payer: Self-pay

## 2011-09-18 ENCOUNTER — Encounter (INDEPENDENT_AMBULATORY_CARE_PROVIDER_SITE_OTHER): Payer: Self-pay

## 2014-12-22 ENCOUNTER — Emergency Department (HOSPITAL_COMMUNITY)
Admission: EM | Admit: 2014-12-22 | Discharge: 2014-12-22 | Disposition: A | Discharge status: Home / Self Care | Payer: Self-pay | Attending: Emergency Medicine | Admitting: Emergency Medicine

## 2014-12-22 ENCOUNTER — Emergency Department (HOSPITAL_COMMUNITY): Payer: Self-pay

## 2014-12-22 ENCOUNTER — Encounter (HOSPITAL_COMMUNITY): Payer: Self-pay | Admitting: *Deleted

## 2014-12-22 DIAGNOSIS — S79922A Unspecified injury of left thigh, initial encounter: Secondary | ICD-10-CM | POA: Insufficient documentation

## 2014-12-22 DIAGNOSIS — Y998 Other external cause status: Secondary | ICD-10-CM | POA: Insufficient documentation

## 2014-12-22 DIAGNOSIS — Y9289 Other specified places as the place of occurrence of the external cause: Secondary | ICD-10-CM | POA: Insufficient documentation

## 2014-12-22 DIAGNOSIS — Y939 Activity, unspecified: Secondary | ICD-10-CM | POA: Insufficient documentation

## 2014-12-22 DIAGNOSIS — X58XXXA Exposure to other specified factors, initial encounter: Secondary | ICD-10-CM | POA: Insufficient documentation

## 2014-12-22 DIAGNOSIS — M79606 Pain in leg, unspecified: Secondary | ICD-10-CM

## 2014-12-22 DIAGNOSIS — R58 Hemorrhage, not elsewhere classified: Secondary | ICD-10-CM | POA: Insufficient documentation

## 2014-12-22 MED ORDER — NAPROXEN 500 MG PO TABS: 500.0000 mg | ORAL_TABLET | Freq: Two times a day (BID) | ORAL | Status: AC

## 2014-12-22 MED ORDER — METHOCARBAMOL 500 MG PO TABS: 500.0000 mg | ORAL_TABLET | Freq: Two times a day (BID) | ORAL | Status: AC

## 2014-12-22 MED ORDER — NAPROXEN 250 MG PO TABS
500.0000 mg | ORAL_TABLET | Freq: Once | ORAL | Status: AC
Start: 2014-12-22 — End: 2014-12-22
  Administered 2014-12-22: 500 mg via ORAL
  Filled 2014-12-22: qty 2

## 2014-12-22 NOTE — ED Notes (Signed)
Patient transported to X-ray 

## 2014-12-22 NOTE — ED Provider Notes (Signed)
CSN: [id number]     Arrival date & time 12/22/14  1407 History   First MD Initiated Contact with Patient 12/22/14 1524     Chief Complaint  Patient presents with  . Groin Pain     (Consider location/radiation/quality/duration/timing/severity/associated sxs/prior Treatment) Patient is a 39 y.o. male presenting with leg pain.  Leg Pain Location:  Leg Time since incident:  2 weeks Injury: yes   Mechanism of injury comment:  Twisted and overstretched the groin area Leg location:  L upper leg Pain details:    Quality:  Aching   Radiates to:  Groin   Severity:  Moderate   Onset quality:  Sudden   Timing:  Constant   Progression:  Waxing and waning Chronicity:  New Foreign body present:  No foreign bodies Relieved by:  Immobilization Worsened by:  Adduction and abduction Ineffective treatments: Excedrin. Associated symptoms: decreased ROM   Associated symptoms: no back pain, no fatigue, no fever, no neck pain, no stiffness, no swelling and no tingling   Associated symptoms comment:  Bruising   Past Medical History  Diagnosis Date  . No pertinent past medical history    Past Surgical History  Procedure Laterality Date  . Fracture surgery      "broke left elbow; playing basketball"  . Laparoscopic appendectomy  08/14/2011    Procedure: APPENDECTOMY LAPAROSCOPIC;  Surgeon: Liz Malady, MD;  Location: [facility] OR;  Service: General;  Laterality: N/A;   Family History  Problem Relation Age of Onset  . Stroke Father   . Cancer Maternal Grandmother     breast   History  Substance Use Topics  . Smoking status: Never Smoker   . Smokeless tobacco: Never Used  . Alcohol Use: 0.0 oz/week     Comment: 08/13/11 "occasionally; last time was last week; had shot of rum"    Review of Systems  Constitutional: Negative for fever, chills, appetite change and fatigue.  HENT: Negative for congestion, ear pain, facial swelling, mouth sores and sore throat.   Eyes: Negative for visual  disturbance.  Respiratory: Negative for cough, chest tightness and shortness of breath.   Cardiovascular: Negative for chest pain and palpitations.  Gastrointestinal: Negative for nausea, vomiting, abdominal pain, diarrhea and blood in stool.  Endocrine: Negative for cold intolerance and heat intolerance.  Genitourinary: Negative for frequency, decreased urine volume and difficulty urinating.  Musculoskeletal: Negative for back pain, stiffness, neck pain and neck stiffness.  Skin: Negative for rash.  Neurological: Negative for dizziness, weakness, light-headedness and headaches.  All other systems reviewed and are negative.     Allergies  Review of patient's allergies indicates no known allergies.  Home Medications   Prior to Admission medications   Medication Sig Start Date End Date Taking? Authorizing Provider  methocarbamol (ROBAXIN) 500 MG tablet Take 1 tablet (500 mg total) by mouth 2 (two) times daily. 12/22/14 01/01/15  Drema Pry, MD  naproxen (NAPROSYN) 500 MG tablet Take 1 tablet (500 mg total) by mouth 2 (two) times daily. 12/22/14 01/01/15  Drema Pry, MD   BP 127/91 mmHg  Pulse 72  Temp(Src) 98 F (36.7 C) (Oral)  Resp 16  SpO2 100% Physical Exam  Constitutional: He is oriented to person, place, and time. He appears well-nourished. No distress.  HENT:  Head: Normocephalic and atraumatic.  Right Ear: External ear normal.  Left Ear: External ear normal.  Eyes: Pupils are equal, round, and reactive to light. Right eye exhibits no discharge. Left eye exhibits no discharge. No  scleral icterus.  Neck: Normal range of motion. Neck supple.  Cardiovascular: Normal rate.  Exam reveals no gallop and no friction rub.   No murmur heard. Pulmonary/Chest: Effort normal and breath sounds normal. No stridor. No respiratory distress. He has no wheezes. He has no rales. He exhibits no tenderness.  Abdominal: Soft. He exhibits no distension and no mass. There is no tenderness.  There is no rebound and no guarding.  Musculoskeletal: He exhibits no edema or tenderness.       Legs: Tenderness to palpation and ecchymosis over the area noted in the image. Full range of motion however painful with external rotation and abduction.  Neurological: He is alert and oriented to person, place, and time.  Skin: Skin is warm and dry. No rash noted. He is not diaphoretic. No erythema.    ED Course  Procedures (including critical care time) Labs Review Labs Reviewed - No data to display  Imaging Review Dg Hip Unilat With Pelvis 2-3 Views Left  12/22/2014   CLINICAL DATA:  Left thigh and left groin medial pain, left knee pain for 2 weeks, injury 2 weeks ago pushing a machine  EXAM: LEFT HIP (WITH PELVIS) 2-3 VIEWS  COMPARISON:  None.  FINDINGS: Three views of the left hip submitted. No acute fracture or subluxation. No radiopaque foreign body. Bilateral hip joints are symmetrical in appearance.  IMPRESSION: Negative.   Electronically Signed   By: Natasha Mead M.D.   On: 12/22/2014 16:03     EKG Interpretation None      MDM   38 now and no pertinent past medical history presents with left upper leg pain and ecchymosis for 2 weeks that is a result of twisting and overstretching the groin area. This happened while at work using the buffer. He reports that he lost control and got pulled. Rest of the history and exam as above. Presentation concerning for muscle strain of the vastus medialis or sprain of its tendon. Plain film negative for acute fractures. Patient provided with NSAIDs for pain relief. He is in good condition safely discharged with strict return precautions. The patient will follow-up with orthopedic surgery for follow-up if symptoms do not resolve or worsen.  Patient seen in conjunction with Dr. Littie Deeds.  Deniece Portela, M.D. Resident  Final diagnoses:  Leg pain  Ecchymosis        Drema Pry, MD 12/23/14 1610  Mirian Mo, MD 12/23/14 2329

## 2014-12-22 NOTE — ED Notes (Signed)
MD at bedside. 

## 2014-12-22 NOTE — ED Notes (Signed)
Pt has returned from being out of the department; pt placed on continuous pulse oximetry and blood pressure cuff; visitor at bedside

## 2014-12-22 NOTE — Discharge Instructions (Signed)
Distensin muscular. (Muscle Strain) Una distensin muscular es una lesin que se produce cuando un msculo se estira ms all de su largo normal. Cuando esto sucede, por lo general se desgarra un pequeo nmero de fibras musculares. La distensin muscular se califica en grados. Las distensiones de primer grado son aquellas en las cuales el desgarro y el dolor afectan a la menor cantidad de fibras musculares. Las distensiones de segundo y tercer grado involucran una proporcin cada vez mayor de desgarro y dolor.  En general, la recuperacin de una distensin muscular tarda de 1 a 2semanas. La curacin completa tarda de 5 a 6semanas.  CAUSAS  Las distensiones musculares ocurren cuando se aplica una fuerza violenta y repentina sobre un msculo y este se estira demasiado. Esto puede ocurrir cuando se levantan objetos, se practican deportes o en una cada.  FACTORES DE RIESGO La distensin muscular es especialmente comn en los atletas.  SIGNOS Y SNTOMAS En el lugar de la distensin muscular se puede presentar lo siguiente:  Dolor.  Moretones.  Hinchazn.  Dificultad para usar el msculo debido al dolor o a un funcionamiento anormal. DIAGNSTICO  El mdico le har un examen fsico y le har preguntas sobre sus antecedentes mdicos. TRATAMIENTO  Con frecuencia, el mejor tratamiento para una distensin muscular es el reposo, y la aplicacin de hielo y de compresas fras en la zona de la lesin.  INSTRUCCIONES PARA EL CUIDADO EN EL HOGAR   Use el mtodo PRICE (por sus siglas en ingls) de tratamiento para estimular la curacin durante los primeros 2 a 3das posteriores a la lesin. El mtodo PRICE implica lo siguiente:  Proteger al msculo de nuevas lesiones.  Limitar la actividad y descansar la parte del cuerpo lesionada.  Aplicar hielo a la lesin. Para hacerlo, ponga hielo en una bolsa plstica. Coloque una toalla entre la piel y la bolsa de hielo. Luego aplique el hielo y djelo actuar  de 15 a 20minutos por hora. Despus del tercer da, cambie a compresas de calor hmedo.  Comprimir la zona lesionada con una frula o venda elstica. Tenga cuidado de no ajustarla demasiado. Esto puede interferir con la circulacin sangunea o aumentar la hinchazn.  Mantener la zona lesionada por encima del nivel del corazn con la mayor frecuencia posible.  Utilice los medicamentos de venta libre o recetados para calmar el dolor, el malestar o la fiebre, segn se lo indique el mdico.  Realizar un calentamiento antes de hacer ejercicio ayuda a prevenir distensiones musculares futuras. SOLICITE ATENCIN MDICA SI:   Siente un dolor cada vez ms intenso o hinchazn en la zona lesionada.  Siente adormecimiento, hormigueo o nota una prdida importante de fuerza en la zona lesionada. ASEGRESE DE QUE:   Comprende estas instrucciones.  Controlar su afeccin.  Recibir ayuda de inmediato si no mejora o si empeora. Document Released: 04/03/2005 Document Revised: 04/14/2013 [employer] Patient Information 2015 [employer]. This information is not intended to replace advice given to you by your health care provider. Make sure you discuss any questions you have with your health care provider.  

## 2014-12-22 NOTE — ED Notes (Signed)
Pt is here with 2 week history of left groin pain with some swelling that radiates to his abdomen and worse with ambulation.  Pt states occasionally it will radiate to testicle

## 2016-01-15 ENCOUNTER — Encounter (HOSPITAL_COMMUNITY): Payer: Self-pay | Admitting: Family Medicine

## 2016-01-15 ENCOUNTER — Emergency Department (HOSPITAL_COMMUNITY): Payer: Self-pay

## 2016-01-15 ENCOUNTER — Emergency Department (HOSPITAL_COMMUNITY)
Admission: EM | Admit: 2016-01-15 | Discharge: 2016-01-16 | Disposition: A | Discharge status: Home / Self Care | Payer: Self-pay | Attending: Emergency Medicine | Admitting: Emergency Medicine

## 2016-01-15 DIAGNOSIS — R519 Headache, unspecified: Secondary | ICD-10-CM

## 2016-01-15 DIAGNOSIS — R51 Headache: Secondary | ICD-10-CM | POA: Insufficient documentation

## 2016-01-15 DIAGNOSIS — Z79899 Other long term (current) drug therapy: Secondary | ICD-10-CM | POA: Insufficient documentation

## 2016-01-15 LAB — I-STAT TROPONIN, ED: TROPONIN I, POC: 0 ng/mL (ref 0.00–0.08)

## 2016-01-15 LAB — I-STAT CHEM 8, ED
BUN: 11 mg/dL (ref 6–20)
Calcium, Ion: 1.19 mmol/L (ref 1.13–1.30)
Chloride: 101 mmol/L (ref 101–111)
Creatinine, Ser: 1.1 mg/dL (ref 0.61–1.24)
Glucose, Bld: 99 mg/dL (ref 65–99)
HEMATOCRIT: 42 % (ref 39.0–52.0)
HEMOGLOBIN: 14.3 g/dL (ref 13.0–17.0)
Potassium: 3.1 mmol/L — ABNORMAL LOW (ref 3.5–5.1)
SODIUM: 143 mmol/L (ref 135–145)
TCO2: 29 mmol/L (ref 0–100)

## 2016-01-15 MED ORDER — PROCHLORPERAZINE EDISYLATE 5 MG/ML IJ SOLN
10.0000 mg | Freq: Once | INTRAMUSCULAR | Status: AC
  Administered 2016-01-15: 10 mg via INTRAVENOUS
  Filled 2016-01-15: qty 2

## 2016-01-15 MED ORDER — SODIUM CHLORIDE 0.9 % IV BOLUS (SEPSIS)
1000.0000 mL | Freq: Once | INTRAVENOUS | Status: AC
  Administered 2016-01-15: 1000 mL via INTRAVENOUS

## 2016-01-15 MED ORDER — DIPHENHYDRAMINE HCL 50 MG/ML IJ SOLN
25.0000 mg | Freq: Once | INTRAMUSCULAR | Status: AC
  Administered 2016-01-15: 25 mg via INTRAVENOUS
  Filled 2016-01-15: qty 1

## 2016-01-15 MED ORDER — KETOROLAC TROMETHAMINE 30 MG/ML IJ SOLN
30.0000 mg | Freq: Once | INTRAMUSCULAR | Status: AC
  Administered 2016-01-15: 30 mg via INTRAVENOUS
  Filled 2016-01-15: qty 1

## 2016-01-15 MED ORDER — DEXAMETHASONE SODIUM PHOSPHATE 10 MG/ML IJ SOLN
10.0000 mg | Freq: Once | INTRAMUSCULAR | Status: AC
  Administered 2016-01-15: 10 mg via INTRAVENOUS
  Filled 2016-01-15: qty 1

## 2016-01-15 MED ORDER — POTASSIUM CHLORIDE CRYS ER 20 MEQ PO TBCR
40.0000 meq | EXTENDED_RELEASE_TABLET | Freq: Once | ORAL | Status: AC
  Administered 2016-01-16: 40 meq via ORAL
  Filled 2016-01-15: qty 2

## 2016-01-15 NOTE — ED Provider Notes (Signed)
CSN: [id number]     Arrival date & time 01/15/16  1659 History   First MD Initiated Contact with Patient 01/15/16 2225     Chief Complaint  Patient presents with  . Headache     (Consider location/radiation/quality/duration/timing/severity/associated sxs/prior Treatment) Patient is a 40 y.o. male presenting with headaches.  Headache Pain location:  Occipital Severity currently:  4/10 Severity at highest:  4/10 Onset quality:  Gradual Duration:  3 days Timing:  Constant Progression:  Worsening Chronicity:  New Similar to prior headaches: no   Relieved by:  None tried Worsened by:  Nothing Ineffective treatments:  None tried Associated symptoms: no abdominal pain, no eye pain and no neck pain     Past Medical History  Diagnosis Date  . No pertinent past medical history    Past Surgical History  Procedure Laterality Date  . Fracture surgery      "broke left elbow; playing basketball"  . Laparoscopic appendectomy  08/14/2011    Procedure: APPENDECTOMY LAPAROSCOPIC;  Surgeon: Liz Malady, MD;  Location: [facility] OR;  Service: General;  Laterality: N/A;   Family History  Problem Relation Age of Onset  . Stroke Father   . Cancer Maternal Grandmother     breast   Social History  Substance Use Topics  . Smoking status: Never Smoker   . Smokeless tobacco: Never Used  . Alcohol Use: 0.0 oz/week     Comment: 08/13/11 "occasionally; last time was last week; had shot of rum"    Review of Systems  Eyes: Negative for pain.  Gastrointestinal: Negative for abdominal pain.  Musculoskeletal: Negative for neck pain.  Neurological: Positive for headaches.  All other systems reviewed and are negative.     Allergies  Review of patient's allergies indicates no known allergies.  Home Medications   Prior to Admission medications   Medication Sig Start Date End Date Taking? Authorizing Provider  acetaminophen (TYLENOL) 500 MG tablet Take 500 mg by mouth every 6 (six) hours as  needed for mild pain.   Yes Historical Provider, MD  diphenhydrAMINE (BENADRYL) 25 MG tablet Take 1 tablet (25 mg total) by mouth at bedtime as needed for sleep. 01/16/16   Marily Memos, MD  Melatonin 3 MG TABS Take 1 tablet (3 mg total) by mouth at bedtime as needed (sleep). 01/16/16   Marily Memos, MD  potassium chloride SA (K-DUR,KLOR-CON) 20 MEQ tablet Take 2 tablets (40 mEq total) by mouth 2 (two) times daily. 01/16/16   Barbara Cower Angelize Ryce, MD   BP 115/58 mmHg  Pulse 52  Temp(Src) 98.8 F (37.1 C) (Oral)  Resp 13  Ht  (1.676 m)  Wt 163 lb (73.936 kg)  BMI 26.32 kg/m2  SpO2 99% Physical Exam  Constitutional: He is oriented to person, place, and time. He appears well-developed and well-nourished.  HENT:  Head: Normocephalic and atraumatic.  Neck: Normal range of motion.  Cardiovascular: Normal rate.   Pulmonary/Chest: Effort normal. No respiratory distress.  Abdominal: Soft. He exhibits no distension. There is no tenderness.  Musculoskeletal: Normal range of motion. He exhibits no edema or tenderness.  Neurological: He is alert and oriented to person, place, and time.  Skin: Skin is warm and dry.  Nursing note and vitals reviewed.   ED Course  Procedures (including critical care time) Labs Review Labs Reviewed  I-STAT CHEM 8, ED - Abnormal; Notable for the following:    Potassium 3.1 (*)    All other components within normal limits  Rosezena Sensor, ED  Imaging Review Dg Chest 2 View  01/15/2016  CLINICAL DATA:  Headaches today.  Central chest pain on inspiration. EXAM: CHEST  2 VIEW COMPARISON:  None. FINDINGS: The heart size and mediastinal contours are within normal limits. Both lungs are clear. The visualized skeletal structures are unremarkable. IMPRESSION: No active cardiopulmonary disease. Electronically Signed   By: Burman NievesWilliam  Stevens M.D.   On: 01/15/2016 23:18   Ct Head Wo Contrast  01/15/2016  CLINICAL DATA:  Left temporal area headaches for 3 days. EXAM: CT HEAD  WITHOUT CONTRAST TECHNIQUE: Contiguous axial images were obtained from the base of the skull through the vertex without intravenous contrast. COMPARISON:  None. FINDINGS: Ventricles and sulci appear symmetrical. No ventricular dilatation. No mass effect or midline shift. No abnormal extra-axial fluid collections. Gray-white matter junctions are distinct. Basal cisterns are not effaced. No evidence of acute intracranial hemorrhage. No depressed skull fractures. Opacification of a single right ethmoid air cell with mild mucosal thickening in the ethmoid air cells. Mastoid air cells are not opacified. IMPRESSION: No acute intracranial abnormalities. Electronically Signed   By: Burman NievesWilliam  Stevens M.D.   On: 01/15/2016 23:15   I have personally reviewed and evaluated these images and lab results as part of my medical decision-making.   EKG Interpretation   Date/Time:  Monday January 15 2016 23:20:00 EDT Ventricular Rate:  56 PR Interval:    QRS Duration: 82 QT Interval:  378 QTC Calculation: 365 R Axis:   70 Text Interpretation:  Sinus rhythm Borderline prolonged PR interval  Borderline T abnormalities, diffuse leads Confirmed by [facility] -[facility] CountyMESNER MD, Barbara CowerJASON  [phone]) on 01/15/2016 11:22:57 PM      MDM   Final diagnoses:  Nonintractable headache, unspecified chronicity pattern, unspecified headache type    The patient arrived with signs and symptoms consistent with a headache. The patient has history of migraines. This feels like previous headache.The patient has a reassuring neuro exam lessening chances of intracranial abnormalities. The patient was given decadron, Compazine, Benadryl with patient's pain significantly improved and is ready for discharge.  Also with fleeting atypical chest pain earlier int he day. ecg with inverted t waves, no cp now, negative troponin, doubt ACS.  The patient remained in no acute distress, hemodynamically stable with reassuring neuro exam. The patient was then discharged from  the Emergency Department with no further acute issues. Recommend follow up with neurologist or PCP within the next week.  Marily MemosJason Shaneta Cervenka, MD 01/16/16 [phone]

## 2016-01-15 NOTE — ED Notes (Signed)
Patient transported to CT and xray 

## 2016-01-15 NOTE — ED Notes (Signed)
Pt here for headache x 3 days and has been using tylenol without relief. Denies vision problems, N,V, injury. No hx of migraines.,

## 2016-01-16 MED ORDER — POTASSIUM CHLORIDE CRYS ER 20 MEQ PO TBCR: 40.0000 meq | EXTENDED_RELEASE_TABLET | Freq: Two times a day (BID) | ORAL | Status: AC

## 2016-01-16 MED ORDER — DIPHENHYDRAMINE HCL 25 MG PO TABS: 25.0000 mg | ORAL_TABLET | Freq: Every evening | ORAL | Status: AC | PRN

## 2016-01-16 MED ORDER — MELATONIN 3 MG PO TABS: 3.0000 mg | ORAL_TABLET | Freq: Every evening | ORAL | Status: AC | PRN

## 2020-08-07 ENCOUNTER — Other Ambulatory Visit: Payer: Self-pay

## 2020-08-07 ENCOUNTER — Emergency Department (HOSPITAL_COMMUNITY)
Admission: EM | Admit: 2020-08-07 | Discharge: 2020-08-08 | Disposition: A | Discharge status: Home / Self Care | Payer: Self-pay | Attending: Emergency Medicine | Admitting: Emergency Medicine

## 2020-08-07 ENCOUNTER — Emergency Department (HOSPITAL_COMMUNITY): Payer: Self-pay

## 2020-08-07 ENCOUNTER — Encounter (HOSPITAL_COMMUNITY): Payer: Self-pay | Admitting: *Deleted

## 2020-08-07 DIAGNOSIS — M25532 Pain in left wrist: Secondary | ICD-10-CM | POA: Insufficient documentation

## 2020-08-07 DIAGNOSIS — M79662 Pain in left lower leg: Secondary | ICD-10-CM | POA: Insufficient documentation

## 2020-08-07 DIAGNOSIS — M25572 Pain in left ankle and joints of left foot: Secondary | ICD-10-CM | POA: Insufficient documentation

## 2020-08-07 DIAGNOSIS — Z5321 Procedure and treatment not carried out due to patient leaving prior to being seen by health care provider: Secondary | ICD-10-CM | POA: Insufficient documentation

## 2020-08-07 DIAGNOSIS — Y92410 Unspecified street and highway as the place of occurrence of the external cause: Secondary | ICD-10-CM | POA: Insufficient documentation
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
## 2020-08-08 NOTE — ED Notes (Signed)
Patient states he will go see his primary care doctor in the morning because the wait is too long

## 2021-08-05 ENCOUNTER — Encounter (HOSPITAL_COMMUNITY): Payer: Self-pay

## 2021-08-05 ENCOUNTER — Other Ambulatory Visit: Payer: Self-pay

## 2021-08-05 ENCOUNTER — Emergency Department (HOSPITAL_COMMUNITY)
Admission: EM | Admit: 2021-08-05 | Discharge: 2021-08-06 | Disposition: A | Discharge status: Home / Self Care | Payer: Self-pay | Attending: Emergency Medicine | Admitting: Emergency Medicine

## 2021-08-05 DIAGNOSIS — F17290 Nicotine dependence, other tobacco product, uncomplicated: Secondary | ICD-10-CM | POA: Insufficient documentation

## 2021-08-05 DIAGNOSIS — K0889 Other specified disorders of teeth and supporting structures: Secondary | ICD-10-CM | POA: Insufficient documentation

## 2021-08-05 DIAGNOSIS — R220 Localized swelling, mass and lump, head: Secondary | ICD-10-CM | POA: Insufficient documentation

## 2021-08-05 NOTE — ED Triage Notes (Signed)
Pt presents to the ED from home with complaints of right cheek swelling and pain onset yesterday. Pt denies broken teeth but noted red swollen gums on upper right mouth. Pt also complaining of headache.

## 2021-08-06 MED ORDER — CLINDAMYCIN HCL 150 MG PO CAPS: 300.0000 mg | ORAL_CAPSULE | Freq: Three times a day (TID) | ORAL | 0 refills | Status: AC

## 2021-08-06 MED ORDER — HYDROCODONE-ACETAMINOPHEN 5-325 MG PO TABS
1.0000 | ORAL_TABLET | Freq: Once | ORAL | Status: AC
  Administered 2021-08-06: 1 via ORAL
  Filled 2021-08-06: qty 1

## 2021-08-06 MED ORDER — CLINDAMYCIN HCL 150 MG PO CAPS
300.0000 mg | ORAL_CAPSULE | Freq: Once | ORAL | Status: AC
  Administered 2021-08-06: 300 mg via ORAL
  Filled 2021-08-06: qty 2

## 2021-08-06 NOTE — ED Provider Notes (Signed)
[facility] EMERGENCY DEPARTMENT Provider Note   CSN: [id number] Arrival date & time: 08/05/21  2011     History  Chief Complaint  Patient presents with   Facial Pain    Wesley Arnold is a 46 y.o. male.  HPI Patient is a 46 year old male who presents to the emergency department due to right-sided facial swelling as well as dental pain that began yesterday.  Patient states that his symptoms initially started with pain in the region and then today began noticing some mild soft tissue swelling.  Denies any broken teeth, fevers, chills, nausea, vomiting, sore throat, difficulty swallowing, shortness of breath.  He does note intermittent headaches.  States he smokes cigars intermittently but denies any persistent tobacco use.    Home Medications Prior to Admission medications   Medication Sig Start Date End Date Taking? Authorizing Provider  clindamycin (CLEOCIN) 150 MG capsule Take 2 capsules (300 mg total) by mouth 3 (three) times daily for 10 days. 08/06/21 08/16/21 Yes Placido Sou, PA-C  acetaminophen (TYLENOL) 500 MG tablet Take 500 mg by mouth every 6 (six) hours as needed for mild pain.    [provider]  diphenhydrAMINE (BENADRYL) 25 MG tablet Take 1 tablet (25 mg total) by mouth at bedtime as needed for sleep. 01/16/16   Mesner, Barbara Cower, MD  Melatonin 3 MG TABS Take 1 tablet (3 mg total) by mouth at bedtime as needed (sleep). 01/16/16   Mesner, Barbara Cower, MD  potassium chloride SA (K-DUR,KLOR-CON) 20 MEQ tablet Take 2 tablets (40 mEq total) by mouth 2 (two) times daily. 01/16/16   Mesner, Barbara Cower, MD      Allergies    Patient has no known allergies.    Review of Systems   Review of Systems  Constitutional:  Negative for chills and fever.  HENT:  Positive for dental problem and facial swelling. Negative for drooling, sore throat and trouble swallowing.   Respiratory:  Negative for shortness of breath.   Gastrointestinal:  Negative for nausea and  vomiting.   Physical Exam Updated Vital Signs BP (!) 145/96    Pulse 81    Temp 98.4 F (36.9 C) (Oral)    Resp 20    Ht 5\' 6"  (1.676 m)    Wt 81.6 kg    SpO2 100%    BMI 29.05 kg/m  Physical Exam Vitals and nursing note reviewed.  Constitutional:      General: He is not in acute distress.    Appearance: He is well-developed.  HENT:     Head: Normocephalic and atraumatic.     Right Ear: External ear normal.     Left Ear: External ear normal.     Nose:     Comments: Mild tenderness with mild soft tissue swelling along the right maxillary region.  No overlying erythema or fluctuance.    Mouth/Throat:     Mouth: Mucous membranes are moist.     Pharynx: Oropharynx is clear. No oropharyngeal exudate or posterior oropharyngeal erythema.     Comments: Uvula midline.  Readily handling secretions.  No hot potato voice.  No erythema or exudates noted in the posterior oropharynx.  Generally normal-appearing dentition.  Mild swelling and tenderness noted along the gumline just superior to the right upper canine.  Mild erythema in the region.  No significant fluctuance. Eyes:     General: No scleral icterus.       Right eye: No discharge.        Left eye:  No discharge.     Conjunctiva/sclera: Conjunctivae normal.  Neck:     Trachea: No tracheal deviation.  Cardiovascular:     Rate and Rhythm: Normal rate.  Pulmonary:     Effort: Pulmonary effort is normal. No respiratory distress.     Breath sounds: No stridor.  Abdominal:     General: There is no distension.  Musculoskeletal:        General: No swelling or deformity.     Cervical back: Neck supple.  Skin:    General: Skin is warm and dry.     Findings: No rash.  Neurological:     Mental Status: He is alert.     Cranial Nerves: Cranial nerve deficit: no gross deficits.    ED Results / Procedures / Treatments   Labs (all labs ordered are listed, but only abnormal results are displayed) Labs Reviewed - No data to  display  EKG None  Radiology No results found.  Procedures Procedures   Medications Ordered in ED Medications  clindamycin (CLEOCIN) capsule 300 mg (has no administration in time range)  HYDROcodone-acetaminophen (NORCO/VICODIN) 5-325 MG per tablet 1 tablet (has no administration in time range)   ED Course/ Medical Decision Making/ A&P                           Medical Decision Making Risk Prescription drug management.  Patient is a 46 year old male who presents to the emergency department with what appears to be a developing dental infection.  Initially started with dental pain yesterday and began noticing some soft tissue swelling in the face today.  Also notes intermittent headaches.  No other complaints.  On my exam patient has a region of swelling, erythema, and tenderness along the gumline just superior to the right upper canine.  No palpable fluctuance.  Generally normal-appearing dentition.  Also has some mild soft tissue swelling and tenderness along the right maxillary region.  No overlying erythema or fluctuance.  Uvula midline.  Readily handling secretions.  Soft compartments.  Doubt Ludwig's angina or PTA at this time.  Patient afebrile and not tachycardic.  Nontoxic-appearing.  Will discharge patient on a course of clindamycin.  First dose given in the emergency department.  Patient given a dose of Vicodin for his acute pain.  He has a ride home.  Recommended continued use of Tylenol as well as Motrin.  We discussed dosing.  Patient given a referral to a local dental provider.  We discussed return precautions at length.  Feel he is stable for discharge at this time and he is agreeable.  His questions were answered and he was amicable at the time of discharge. Final Clinical Impression(s) / ED Diagnoses Final diagnoses:  Pain, dental   Rx / DC Orders ED Discharge Orders          Ordered    clindamycin (CLEOCIN) 150 MG capsule  3 times daily        08/06/21 0030               Placido Sou, PA-C 08/06/21 0037    Tilden Fossa, MD 08/06/21 [phone]

## 2021-08-06 NOTE — Discharge Instructions (Addendum)
I recommend a combination of tylenol and ibuprofen for management of your pain. You can take a low dose of both at the same time. I recommend 500 mg of Tylenol combined with 600 mg of ibuprofen. This is one maximum strength Tylenol and three regular ibuprofen. You can take these 2-3 times for day for your pain. Please try to take these medications with a small amount of food as well to prevent upsetting your stomach.  I am going to prescribe you an antibiotic called clindamycin.  I want you to take 300 mg 3 times per day for the next 10 days.  Do not stop taking this antibiotic early even if you feel that your symptoms have resolved.  Below is the contact information for Dr. Mia Creek.  He is a Radiation protection practitioner.  Please give them a call and schedule an appointment for reevaluation.  If you develop any new or worsening symptoms whatsoever please come back to the emergency department for reevaluation.
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
